# Patient Record
Sex: Female | Born: 1965 | Race: White | Hispanic: No | Marital: Single | Smoking: Never smoker
Health system: Southern US, Community
[De-identification: ages and names within clinical notes are randomized; demographics above are authoritative.]

## PROBLEM LIST (undated history)

## (undated) DIAGNOSIS — R87629 Unspecified abnormal cytological findings in specimens from vagina: Secondary | ICD-10-CM

## (undated) DIAGNOSIS — Z789 Other specified health status: Secondary | ICD-10-CM

## (undated) HISTORY — PX: CERVICAL BIOPSY  W/ LOOP ELECTRODE EXCISION: SUR135

## (undated) HISTORY — DX: Other specified health status: Z78.9

## (undated) HISTORY — DX: Unspecified abnormal cytological findings in specimens from vagina: R87.629

---

## 2012-12-03 HISTORY — PX: OTHER SURGICAL HISTORY: SHX169

## 2017-10-07 ENCOUNTER — Other Ambulatory Visit (HOSPITAL_BASED_OUTPATIENT_CLINIC_OR_DEPARTMENT_OTHER): Payer: Self-pay

## 2017-10-07 DIAGNOSIS — Z1231 Encounter for screening mammogram for malignant neoplasm of breast: Secondary | ICD-10-CM

## 2017-11-02 ENCOUNTER — Ambulatory Visit (HOSPITAL_BASED_OUTPATIENT_CLINIC_OR_DEPARTMENT_OTHER)
Admission: RE | Admit: 2017-11-02 | Discharge: 2017-11-02 | Disposition: A | Discharge status: Home / Self Care | Payer: PRIVATE HEALTH INSURANCE | Source: Ambulatory Visit

## 2017-11-02 ENCOUNTER — Encounter (HOSPITAL_BASED_OUTPATIENT_CLINIC_OR_DEPARTMENT_OTHER): Payer: Self-pay

## 2017-11-02 DIAGNOSIS — Z1231 Encounter for screening mammogram for malignant neoplasm of breast: Secondary | ICD-10-CM

## 2017-12-14 ENCOUNTER — Encounter (HOSPITAL_BASED_OUTPATIENT_CLINIC_OR_DEPARTMENT_OTHER): Payer: Self-pay

## 2017-12-14 ENCOUNTER — Ambulatory Visit (HOSPITAL_BASED_OUTPATIENT_CLINIC_OR_DEPARTMENT_OTHER)
Admission: RE | Admit: 2017-12-14 | Discharge: 2017-12-14 | Disposition: A | Discharge status: Home / Self Care | Payer: No Typology Code available for payment source | Source: Ambulatory Visit | Attending: Body Imaging | Admitting: Body Imaging

## 2017-12-14 DIAGNOSIS — Z1231 Encounter for screening mammogram for malignant neoplasm of breast: Secondary | ICD-10-CM | POA: Insufficient documentation

## 2018-01-17 ENCOUNTER — Ambulatory Visit (INDEPENDENT_AMBULATORY_CARE_PROVIDER_SITE_OTHER): Payer: PRIVATE HEALTH INSURANCE | Admitting: Family Medicine

## 2018-01-17 ENCOUNTER — Encounter: Payer: Self-pay | Admitting: Family Medicine

## 2018-01-17 VITALS — BP 132/80 | HR 94 | Temp 98.3°F | Ht 63.0 in | Wt 201.0 lb

## 2018-01-17 DIAGNOSIS — L989 Disorder of the skin and subcutaneous tissue, unspecified: Secondary | ICD-10-CM

## 2018-01-17 DIAGNOSIS — R002 Palpitations: Secondary | ICD-10-CM | POA: Diagnosis not present

## 2018-01-17 LAB — CBC
HCT: 38.4 % (ref 36.0–46.0)
Hemoglobin: 13.1 g/dL (ref 12.0–15.0)
MCHC: 34.2 g/dL (ref 30.0–36.0)
MCV: 89.5 fl (ref 78.0–100.0)
PLATELETS: 189 10*3/uL (ref 150.0–400.0)
RBC: 4.29 Mil/uL (ref 3.87–5.11)
RDW: 13.7 % (ref 11.5–15.5)
WBC: 4.1 10*3/uL (ref 4.0–10.5)

## 2018-01-17 LAB — TSH: TSH: 5.78 u[IU]/mL — ABNORMAL HIGH (ref 0.35–4.50)

## 2018-01-17 LAB — BASIC METABOLIC PANEL
BUN: 12 mg/dL (ref 6–23)
CHLORIDE: 105 meq/L (ref 96–112)
CO2: 28 meq/L (ref 19–32)
Calcium: 8.9 mg/dL (ref 8.4–10.5)
Creatinine, Ser: 0.66 mg/dL (ref 0.40–1.20)
GFR: 100.67 mL/min (ref >60.00)
GLUCOSE: 102 mg/dL — AB (ref 70–99)
POTASSIUM: 3.5 meq/L (ref 3.5–5.1)
SODIUM: 141 meq/L (ref 135–145)

## 2018-01-17 LAB — MAGNESIUM: Magnesium: 2 mg/dL (ref 1.5–2.5)

## 2018-01-17 NOTE — Patient Instructions (Addendum)
Stay well hydrated.  Mind your caffeine intake.    Keep an eye on the area next to your forehead. I think the dye has caused the area to become inflamed and irritated. If things don't settle down, we can always remove it and send it to the pathology lab to have someone take a closer look.   Let us know if you need anything.  Call Center for [place] at [facility] at [phone] for an appointment.  They are located at [address], [place], [place], [address] (right across the hall from our office).

## 2018-01-17 NOTE — Progress Notes (Signed)
Chief Complaint  Patient presents with  . Establish Care       New Patient Visit SUBJECTIVE: HPI: Yolanda Barber is an 52 y.o.female who is being seen for establishing care.  2 years has had spot on L side of face, has started to raise up over the past 3 weeks. She did note that she dyed her hair around this time and it started itching after that. No pain, some mild itching. No other new topicals. No personal or famhx of skin cancer.  She has been having palpitations intermittently for the past 2 years. No CP or SOB a/w, happens randomly. Usually lasts for a few seconds. No hx of anxiety. She does consume quite a bit of caffeine. No other know heart disease. EKG in past was neg, she is not currently having s/s's. Has it around 3-4 times per week.    Allergies  Allergen Reactions  . Contrast Media [Iodinated Diagnostic Agents]     Past Medical History:  Diagnosis Date  . No known health problems    Past Surgical History:  Procedure Laterality Date  . cholestetoma  2014   Social History   Socioeconomic History  . Marital status: Single  Tobacco Use  . Smoking status: Never Smoker  . Smokeless tobacco: Never Used  Substance and Sexual Activity  . Alcohol use: No    Frequency: Never  . Drug use: No   Family History  Problem Relation Age of Onset  . Hemophilia Brother     Takes no meds routinely.  No LMP recorded. Patient has had an ablation.  ROS Cardiovascular: Denies chest pain, +palpitations  Respiratory: Denies dyspnea   OBJECTIVE: BP 132/80 (BP Location: Left Arm, Patient Position: Sitting, Cuff Size: Normal)   Pulse 94   Temp 98.3 F (36.8 C) (Oral)   Ht 5\' 3"  (1.6 m)   Wt 201 lb (91.2 kg)   SpO2 97%   BMI 35.61 kg/m   Constitutional: -  VS reviewed -  Well developed, well nourished, appears stated age -  No apparent distress  Psychiatric: -  Oriented to person, place, and time -  Memory intact -  Affect and mood normal -  Fluent conversation,  good eye contact -  Judgment and insight age appropriate  Eye: -  Conjunctivae clear, no discharge -  Pupils symmetric, round, reactive to light  ENMT: -  MMM    Pharynx moist, no exudate, no erythema  Neck: -  No gross swelling, no palpable masses -  Thyroid midline, not enlarged, mobile, no palpable masses  Cardiovascular: -  RRR -  No LE edema  Respiratory: -  Normal respiratory effort, no accessory muscle use, no retraction -  Breath sounds equal, no wheezes, no ronchi, no crackles  Musculoskeletal: -  No clubbing, no cyanosis -  Gait normal  Skin: -  See below -  Warm and dry to palpation   Media Information     ASSESSMENT/PLAN: Skin lesion  Palpitations - Plan: CBC, Basic metabolic panel, TSH, Magnesium, HOLTER MONITOR - 72 HOUR  Keep an eye on skin lesion. Appears to be an inflamed SK. If continues to change, will let us know and we will biopsy it.  Ck labs, Holter for palpitations. Since she is not having current symptoms, will save time and money with avoiding EKG.  Patient should return at earliest convenience for CPE, fasting. The patient voiced understanding and agreement to the plan.   Wolfe, DO 01/17/18  2:18 PM

## 2018-01-17 NOTE — Progress Notes (Signed)
Pre visit review using our clinic review tool, if applicable. No additional management support is needed unless otherwise documented below in the visit note. 

## 2018-01-20 ENCOUNTER — Other Ambulatory Visit: Payer: Self-pay | Admitting: Family Medicine

## 2018-01-20 DIAGNOSIS — R002 Palpitations: Secondary | ICD-10-CM

## 2018-01-24 ENCOUNTER — Other Ambulatory Visit (INDEPENDENT_AMBULATORY_CARE_PROVIDER_SITE_OTHER): Payer: PRIVATE HEALTH INSURANCE

## 2018-01-24 DIAGNOSIS — R002 Palpitations: Secondary | ICD-10-CM | POA: Diagnosis not present

## 2018-01-24 LAB — T4, FREE: Free T4: 0.73 ng/dL (ref 0.60–1.60)

## 2018-02-03 ENCOUNTER — Ambulatory Visit (INDEPENDENT_AMBULATORY_CARE_PROVIDER_SITE_OTHER): Payer: PRIVATE HEALTH INSURANCE

## 2018-02-03 DIAGNOSIS — R002 Palpitations: Secondary | ICD-10-CM

## 2018-05-03 ENCOUNTER — Ambulatory Visit: Payer: Self-pay | Admitting: Podiatry

## 2018-06-20 ENCOUNTER — Encounter: Payer: Self-pay | Admitting: Family Medicine

## 2018-06-20 ENCOUNTER — Ambulatory Visit: Payer: PRIVATE HEALTH INSURANCE | Admitting: Family Medicine

## 2018-06-20 VITALS — BP 118/80 | HR 108 | Temp 98.0°F | Ht 63.0 in | Wt 203.4 lb

## 2018-06-20 DIAGNOSIS — D489 Neoplasm of uncertain behavior, unspecified: Secondary | ICD-10-CM

## 2018-06-20 NOTE — Progress Notes (Signed)
Pre visit review using our clinic review tool, if applicable. No additional management support is needed unless otherwise documented below in the visit note. 

## 2018-06-20 NOTE — Patient Instructions (Signed)
Do not shower for the rest of the day. When you do wash it, use only soap and water. Do not vigorously scrub. Apply triple antibiotic ointment (like Neosporin) twice daily. Keep the area clean and dry.   Things to look out for: increasing pain not relieved by ibuprofen/acetaminophen, fevers, spreading redness, drainage of pus, or foul odor.  Give [place] 1 week to get the results of your biopsy back.  OK to continue your facial cleanser.   Let us know if you need anything.

## 2018-06-20 NOTE — Progress Notes (Signed)
Chief Complaint  Patient presents with  . Follow-up    spot on face has inlarged    Yolanda Barber is a 52 y.o. female here for a skin complaint.  There is a spot on her L face that has gotten bigger and darker. This started around 1 mo ago. She is concerned it could be cancer. Pt did use a new facial cleanser, but no other area on her face has broken out. It does not hurt or itch, she has not tried anything topically at home.  ROS:  Const: No fevers Skin: As noted in HPI  Past Medical History:  Diagnosis Date  . No known health problems    BP 118/80 (BP Location: Left Arm, Patient Position: Sitting, Cuff Size: Normal)   Pulse (!) 108   Temp 98 F (36.7 C) (Oral)   Ht 5\' 3"  (1.6 m)   Wt 203 lb 6 oz (92.3 kg)   SpO2 97%   BMI 36.03 kg/m  Gen: awake, alert, appearing stated age Lungs: No accessory muscle use Skin: see below. No drainage, erythema, TTP, fluctuance, excoriation Psych: Age appropriate judgment and insight   Both larger and darker than prior image.  Procedure note; shave biopsy Informed consent was obtained. The area was cleaned with alcohol and injected with 1 mL of 1% lidocaine with epinephrine. A Dermablade was slightly bent and used to cut under the area of interest. The specimen was placed in a sterile specimen cup and sent to the lab. The area was then cauterized ensuring adequate hemostasis. The area was dressed with triple antibiotic ointment and a bandage. There were no complications noted. The patient tolerated the procedure well.   Neoplasm of uncertain behavior - Plan: PR SHAV SKIN LES 0.6-1.0 CM FACE,FACIAL  Orders as above. 1 week to get biopsy results.  Aftercare instructions provided and verbalized.  F/u prn. The patient voiced understanding and agreement to the plan.  [place], DO 06/20/18 4:02 PM

## 2018-09-17 ENCOUNTER — Telehealth: Payer: Self-pay | Admitting: Family Medicine

## 2018-09-17 NOTE — Telephone Encounter (Signed)
Call Center for [place] at [facility] at [phone] for an appointment.  They are located at [address], [place], [place], [address] (right across the hall from our office).  Doesn't need a referral, just call that number. If for whatever reason she does need one, OK to put through. TY.

## 2018-09-17 NOTE — Telephone Encounter (Signed)
Called the patient informed of PCP instructions 

## 2018-09-17 NOTE — Telephone Encounter (Signed)
Copied from [place] [id number]. Topic: General - Other >> Sep 17, 2018  3:58 PM Janace Aris A wrote: Reason for CRM: pt called in wanting to know if she can be referred to a GYN. Says she feels like it's been 2 years and she would like to have one scheduled in November if possible.   Please advise

## 2018-11-13 ENCOUNTER — Encounter: Payer: Self-pay | Admitting: Family Medicine

## 2018-11-13 ENCOUNTER — Ambulatory Visit (INDEPENDENT_AMBULATORY_CARE_PROVIDER_SITE_OTHER): Payer: PRIVATE HEALTH INSURANCE | Admitting: Family Medicine

## 2018-11-13 VITALS — BP 126/85 | HR 87 | Ht 63.0 in | Wt 191.0 lb

## 2018-11-13 DIAGNOSIS — Z01419 Encounter for gynecological examination (general) (routine) without abnormal findings: Secondary | ICD-10-CM | POA: Diagnosis not present

## 2018-11-13 DIAGNOSIS — Z1151 Encounter for screening for human papillomavirus (HPV): Secondary | ICD-10-CM

## 2018-11-13 DIAGNOSIS — Z124 Encounter for screening for malignant neoplasm of cervix: Secondary | ICD-10-CM | POA: Diagnosis not present

## 2018-11-13 DIAGNOSIS — Z113 Encounter for screening for infections with a predominantly sexual mode of transmission: Secondary | ICD-10-CM | POA: Diagnosis not present

## 2018-11-13 NOTE — Progress Notes (Signed)
GYNECOLOGY ANNUAL PREVENTATIVE CARE ENCOUNTER NOTE  Subjective:   Yolanda Barber is a 52 y.o. G79P0 female here for a routine annual gynecologic exam.  Current complaints: none.   Denies abnormal vaginal bleeding, discharge, pelvic pain, problems with intercourse or other gynecologic concerns.    Had endometrial ablation about 2-3 years ago. No menopausal symptoms.  Gynecologic History No LMP recorded. Patient has had an ablation. Patient is sexually active  Contraception: tubal ligation Last Pap: 2018. Results were: normal. Has h/o LEEP 2010 Last mammogram: 2018. Results were: normal  Obstetric History OB History  Gravida Para Term Preterm AB Living  4         4  SAB TAB Ectopic Multiple Live Births          4    # Outcome Date GA Lbr Len/2nd Weight Sex Delivery Anes PTL Lv  4 Gravida      CS-Unspec     3 Gravida      CS-Unspec     2 Gravida           1 [place]             Past Medical History:  Diagnosis Date  . No known health problems   . Vaginal Pap smear, abnormal    LEEP    Past Surgical History:  Procedure Laterality Date  . CERVICAL BIOPSY  W/ LOOP ELECTRODE EXCISION    . cholestetoma  2014    No current outpatient medications on file prior to visit.   No current facility-administered medications on file prior to visit.     Allergies  Allergen Reactions  . Contrast Media [Iodinated Diagnostic Agents]     Social History   Socioeconomic History  . Marital status: Single    Spouse name: Not on file  . Number of children: Not on file  . Years of education: Not on file  . Highest education level: Not on file  Occupational History  . Not on file  Social Needs  . Financial resource strain: Not on file  . Food insecurity:    Worry: Not on file    Inability: Not on file  . Transportation needs:    Medical: Not on file    Non-medical: Not on file  Tobacco Use  . Smoking status: Never Smoker  . Smokeless tobacco: Never Used  Substance and  Sexual Activity  . Alcohol use: No    Frequency: Never  . Drug use: No  . Sexual activity: Not on file  Lifestyle  . Physical activity:    Days per week: Not on file    Minutes per session: Not on file  . Stress: Not on file  Relationships  . Social connections:    Talks on phone: Not on file    Gets together: Not on file    Attends religious service: Not on file    Active member of club or organization: Not on file    Attends meetings of clubs or organizations: Not on file    Relationship status: Not on file  . Intimate partner violence:    Fear of current or ex partner: Not on file    Emotionally abused: Not on file    Physically abused: Not on file    Forced sexual activity: Not on file  Other Topics Concern  . Not on file  Social History Narrative  . Not on file    Family History  Problem Relation Age of Onset  .  Diabetes Mother   . Hemophilia Brother   . Cancer Maternal Grandmother   . Breast cancer Maternal Grandmother   . Hypertension Paternal Grandmother   . Stroke Paternal Aunt     The following portions of the patient's history were reviewed and updated as appropriate: allergies, current medications, past family history, past medical history, past social history, past surgical history and problem list.  Review of Systems Pertinent items are noted in HPI.   Objective:  BP 126/85   Pulse 87   Ht 5\' 3"  (1.6 m)   Wt 191 lb (86.6 kg)   BMI 33.83 kg/m  CONSTITUTIONAL: Well-developed, well-nourished female in no acute distress.  HENT:  Normocephalic, atraumatic, External right and left ear normal. Oropharynx is clear and moist EYES: Conjunctivae and EOM are normal. Pupils are equal, round, and reactive to light. No scleral icterus.  NECK: Normal range of motion, supple, no masses.  Normal thyroid.   CARDIOVASCULAR: Normal heart rate noted, regular rhythm RESPIRATORY: Clear to auscultation bilaterally. Effort and breath sounds normal, no problems with  respiration noted. BREASTS: Symmetric in size. No masses, skin changes, nipple drainage, or lymphadenopathy. ABDOMEN: Soft, normal bowel sounds, no distention noted.  No tenderness, rebound or guarding.  PELVIC: Normal appearing external genitalia; normal appearing vaginal mucosa and cervix.  No abnormal discharge noted.  Normal uterine size, no other palpable masses, no uterine or adnexal tenderness. MUSCULOSKELETAL: Normal range of motion. No tenderness.  No cyanosis, clubbing, or edema.  2+ distal pulses. SKIN: Skin is warm and dry. No rash noted. Not diaphoretic. No erythema. No pallor. NEUROLOGIC: Alert and oriented to person, place, and time. Normal reflexes, muscle tone coordination. No cranial nerve deficit noted. PSYCHIATRIC: Normal mood and affect. Normal behavior. Normal judgment and thought content.  Assessment:  Annual gynecologic examination with pap smear   Plan:  1. Well Woman Exam Will follow up results of pap smear and manage accordingly. Mammogram scheduled STD testing discussed. Patient requested testing  Routine preventative health maintenance measures emphasized. Please refer to After Visit Summary for other counseling recommendations.    Loma Boston, [place] for [employer]

## 2018-11-13 NOTE — Addendum Note (Signed)
Addended by: Phill Myron on: 11/13/2018 02:26 PM   Modules accepted: Orders

## 2018-11-18 LAB — CYTOLOGY - PAP
CHLAMYDIA, DNA PROBE: NEGATIVE
DIAGNOSIS: NEGATIVE
HPV: NOT DETECTED
NEISSERIA GONORRHEA: NEGATIVE

## 2018-11-24 ENCOUNTER — Telehealth: Payer: Self-pay

## 2018-11-24 NOTE — Telephone Encounter (Signed)
Patient made aware of normal pap smear. Kathrene Alu RN

## 2018-12-30 ENCOUNTER — Other Ambulatory Visit: Payer: Self-pay | Admitting: *Deleted

## 2018-12-30 ENCOUNTER — Ambulatory Visit (INDEPENDENT_AMBULATORY_CARE_PROVIDER_SITE_OTHER): Payer: PRIVATE HEALTH INSURANCE | Admitting: *Deleted

## 2018-12-30 ENCOUNTER — Telehealth: Payer: Self-pay | Admitting: *Deleted

## 2018-12-30 DIAGNOSIS — Z111 Encounter for screening for respiratory tuberculosis: Secondary | ICD-10-CM | POA: Diagnosis not present

## 2018-12-30 DIAGNOSIS — Z0184 Encounter for antibody response examination: Secondary | ICD-10-CM

## 2018-12-30 NOTE — Progress Notes (Signed)
.  ve

## 2018-12-30 NOTE — Telephone Encounter (Signed)
Patient notified

## 2018-12-30 NOTE — Progress Notes (Signed)
Patient here for tb test.    Tb test given.    Tb read scheduled for 01/01/19.

## 2018-12-30 NOTE — Telephone Encounter (Signed)
That's fine

## 2018-12-30 NOTE — Telephone Encounter (Signed)
Patient would like to get varicella titers done and possibly hep titer.  She would also like at tdap.  Are you ok with ordering these?

## 2019-01-01 ENCOUNTER — Ambulatory Visit (INDEPENDENT_AMBULATORY_CARE_PROVIDER_SITE_OTHER): Payer: PRIVATE HEALTH INSURANCE | Admitting: *Deleted

## 2019-01-01 ENCOUNTER — Other Ambulatory Visit (INDEPENDENT_AMBULATORY_CARE_PROVIDER_SITE_OTHER): Payer: PRIVATE HEALTH INSURANCE

## 2019-01-01 DIAGNOSIS — Z23 Encounter for immunization: Secondary | ICD-10-CM | POA: Diagnosis not present

## 2019-01-01 DIAGNOSIS — Z0184 Encounter for antibody response examination: Secondary | ICD-10-CM | POA: Diagnosis not present

## 2019-01-01 LAB — TB SKIN TEST
Induration: 0 mm
TB Skin Test: NEGATIVE

## 2019-01-01 NOTE — Addendum Note (Signed)
Addended by: Caffie Pinto on: 01/01/2019 02:18 PM   Modules accepted: Orders

## 2019-01-01 NOTE — Addendum Note (Signed)
Addended by: Caffie Pinto on: 01/01/2019 03:03 PM   Modules accepted: Orders

## 2019-01-02 LAB — VARICELLA ZOSTER ANTIBODY, IGG: Varicella IgG: 1684 index

## 2019-01-02 LAB — HEPATITIS B SURFACE ANTIBODY, QUANTITATIVE: Hep B S AB Quant (Post): 61 m[IU]/mL (ref >=10)

## 2019-01-08 ENCOUNTER — Ambulatory Visit (HOSPITAL_BASED_OUTPATIENT_CLINIC_OR_DEPARTMENT_OTHER): Payer: PRIVATE HEALTH INSURANCE

## 2019-01-16 ENCOUNTER — Inpatient Hospital Stay (HOSPITAL_BASED_OUTPATIENT_CLINIC_OR_DEPARTMENT_OTHER): Admission: RE | Admit: 2019-01-16 | Payer: PRIVATE HEALTH INSURANCE | Source: Ambulatory Visit

## 2019-01-27 ENCOUNTER — Telehealth: Payer: Self-pay | Admitting: Family Medicine

## 2019-01-27 NOTE — Telephone Encounter (Signed)
Spoke with patient notified her of results from 02/13/18 which where normal. She voiced her understanding.  Dr. Nani Ravens she also stated while in class today her Nurse instructor advised her that her heart was irregular and she just wanted to know what to do.  Please advise

## 2019-01-27 NOTE — Telephone Encounter (Signed)
Copied from [place] [id number]. Topic: Quick Communication - See Telephone Encounter >> Jan 27, 2019 12:58 PM Rutherford Nail, NT wrote: CRM for notification. See Telephone encounter for: 01/27/19. Patient calling and states that she had to wear a heart monitor back in 02/03/2018. States that she never received those results.

## 2019-01-28 NOTE — Telephone Encounter (Signed)
Patient informed. 

## 2019-01-28 NOTE — Telephone Encounter (Signed)
Called left message to call back 

## 2019-01-28 NOTE — Telephone Encounter (Signed)
We should have reached out to her. They were normal though. If she is continuing to have issues, can schedule appt if bothersome, but rhythm was reassuring on the testing. TY.

## 2019-01-30 ENCOUNTER — Ambulatory Visit (HOSPITAL_BASED_OUTPATIENT_CLINIC_OR_DEPARTMENT_OTHER)
Admission: RE | Admit: 2019-01-30 | Discharge: 2019-01-30 | Disposition: A | Discharge status: Home / Self Care | Payer: No Typology Code available for payment source | Source: Ambulatory Visit | Attending: Family Medicine | Admitting: Family Medicine

## 2019-01-30 DIAGNOSIS — Z01419 Encounter for gynecological examination (general) (routine) without abnormal findings: Secondary | ICD-10-CM | POA: Diagnosis present

## 2019-01-30 DIAGNOSIS — Z1231 Encounter for screening mammogram for malignant neoplasm of breast: Secondary | ICD-10-CM | POA: Insufficient documentation

## 2019-05-15 ENCOUNTER — Other Ambulatory Visit: Payer: Self-pay

## 2019-05-15 ENCOUNTER — Ambulatory Visit (INDEPENDENT_AMBULATORY_CARE_PROVIDER_SITE_OTHER): Payer: PRIVATE HEALTH INSURANCE | Admitting: Medical

## 2019-05-15 ENCOUNTER — Encounter: Payer: Self-pay | Admitting: Medical

## 2019-05-15 DIAGNOSIS — S30861A Insect bite (nonvenomous) of abdominal wall, initial encounter: Secondary | ICD-10-CM | POA: Diagnosis not present

## 2019-05-15 DIAGNOSIS — W57XXXA Bitten or stung by nonvenomous insect and other nonvenomous arthropods, initial encounter: Secondary | ICD-10-CM

## 2019-05-15 MED ORDER — DOXYCYCLINE HYCLATE 100 MG PO TABS: 100.0000 mg | ORAL_TABLET | Freq: Two times a day (BID) | ORAL | 0 refills | Status: DC

## 2019-05-15 NOTE — Progress Notes (Signed)
Subjective:    Patient ID: Yolanda Barber, female    DOB: 04-13-1966, 53 y.o.   MRN: [id number]  HPI  Virtual Visit via Telephone Note  I connected with Yolanda Barber on 05/15/19 at  3:40 PM EDT by telephone and verified that I am speaking with the correct person using two identifiers.  Location: Patient: home Provider: home    Pt has no blood pressure and pulse.   I discussed the limitations, risks, security and privacy concerns of performing an evaluation and management service by telephone and the availability of in person appointments. I also discussed with the patient that there may be a patient responsible charge related to this service. The patient expressed understanding and agreed to proceed.   History of Present Illness:  Pt had area where she thought was skin tag initially in shower. She scraped it off aggressively and saw red area in middle pin point red bump. Then she got clear area. She noticed ring around region where possible skin tag was. Pt concerned that it was tick bite that could have represnted lyme disease. This happened 9 days ago.   Observations/Objective: General- no acute distress, pleasant, normal speech. Skin- not seen bump and circular rash is on rt side.under breast.  Assessment and Plan: By your description it sounds like you had small tick that you scraped off aggresively and not skin tag Rash that appeared later sounded like bullys eye rash by descrption, I think reasonable to give rx doxycycline antibiotic as this covers both rmsf and lyme disease. Rx advisement given. Discussion on getting tick borne disease antibodies and pt deffered.  Follow up in 7 days or as needed  Mackie Pai, PA-C  Follow Up Instructions:    I discussed the assessment and treatment plan with the patient. The patient was provided an opportunity to ask questions and all were answered. The patient agreed with the plan and demonstrated an understanding of the  instructions.   The patient was advised to call back or seek an in-person evaluation if the symptoms worsen or if the condition fails to improve as anticipated.  I provided 15  minutes of non-face-to-face time during this encounter.   Mackie Pai, PA-C    Review of Systems  Constitutional: Positive for diaphoresis. Negative for chills, fatigue and fever.       No obvious hot flashes.  HENT: Negative for congestion, drooling and ear pain.        Occasionally at night. Not every night.  Respiratory: Negative for cough, chest tightness, shortness of breath and wheezing.   Cardiovascular: Negative for chest pain and palpitations.       Pt has hx of palpitations in th past with negative work up. No abnormal or sustained palpiations.  Gastrointestinal: Negative for abdominal pain and anal bleeding.  Genitourinary: Negative for dysuria, flank pain, frequency, urgency and vaginal pain.  Musculoskeletal: Negative for back pain.  Skin: Positive for rash.  Neurological: Negative for dizziness, seizures, weakness and light-headedness.  Hematological: Negative for adenopathy.  Psychiatric/Behavioral: Negative for behavioral problems, confusion and sleep disturbance. The patient is not nervous/anxious.     Past Medical History:  Diagnosis Date  . No known health problems   . Vaginal Pap smear, abnormal    LEEP     Social History   Socioeconomic History  . Marital status: Single    Spouse name: Not on file  . Number of children: Not on file  . Years of education: Not on file  .  Highest education level: Not on file  Occupational History  . Not on file  Social Needs  . Financial resource strain: Not on file  . Food insecurity    Worry: Not on file    Inability: Not on file  . Transportation needs    Medical: Not on file    Non-medical: Not on file  Tobacco Use  . Smoking status: Never Smoker  . Smokeless tobacco: Never Used  Substance and Sexual Activity  . Alcohol use: No     Frequency: Never  . Drug use: No  . Sexual activity: Not on file  Lifestyle  . Physical activity    Days per week: Not on file    Minutes per session: Not on file  . Stress: Not on file  Relationships  . Social Herbalist on phone: Not on file    Gets together: Not on file    Attends religious service: Not on file    Active member of club or organization: Not on file    Attends meetings of clubs or organizations: Not on file    Relationship status: Not on file  . Intimate partner violence    Fear of current or ex partner: Not on file    Emotionally abused: Not on file    Physically abused: Not on file    Forced sexual activity: Not on file  Other Topics Concern  . Not on file  Social History Narrative  . Not on file    Past Surgical History:  Procedure Laterality Date  . CERVICAL BIOPSY  W/ LOOP ELECTRODE EXCISION    . cholestetoma  2014    Family History  Problem Relation Age of Onset  . Diabetes Mother   . Hemophilia Brother   . Cancer Maternal Grandmother   . Breast cancer Maternal Grandmother   . Hypertension Paternal Grandmother   . Stroke Paternal Aunt     Allergies  Allergen Reactions  . Contrast Media [Iodinated Diagnostic Agents]     No current outpatient medications on file prior to visit.   No current facility-administered medications on file prior to visit.     There were no vitals taken for this visit.     Objective:   Physical Exam        Assessment & Plan:

## 2019-05-15 NOTE — Patient Instructions (Signed)
By your description it sounds like you had small tick that you scraped off aggresively and not skin tag Rash that appeared later sounded like bullys eye rash by descrption, I think reasonable to give rx doxycycline antibiotic as this covers both rmsf and lyme disease. Rx advisement given. Discussion on getting tick borne disease antibodies and pt deffered.  Follow up in 7 days or as needed

## 2019-05-18 ENCOUNTER — Telehealth: Payer: Self-pay | Admitting: Family Medicine

## 2019-05-18 NOTE — Telephone Encounter (Signed)
Please schedule w me. Ty.

## 2019-05-18 NOTE — Telephone Encounter (Signed)
Copied from [place] [id number]. Topic: Quick Communication - See Telephone Encounter >> May 18, 2019  4:45 PM Blase Mess A wrote: CRM for notification. See Telephone encounter for: 05/18/19.  Patient is calling to schedule appt with Dr.Wendling Please advise CB- [phone]

## 2019-05-18 NOTE — Telephone Encounter (Signed)
Copied from [place] [id number]. Topic: Quick Communication - See Telephone Encounter >> May 18, 2019  9:33 AM Blase Mess A wrote: CRM for notification. See Telephone encounter for: 05/18/19.  Patient is calling to get lab work completed after a visit on Friday with Percell Miller. In the after viist summary it states- "tick borne disease antibodies and pt deffered." Patient is calling to have labs placed. Please advise thank you. [phone] Jerilynn Mages)

## 2019-05-18 NOTE — Telephone Encounter (Signed)
Please advise 

## 2019-05-18 NOTE — Telephone Encounter (Signed)
Will you get her scheduled with her pcp/Dr. Nani Ravens. She took doxycycline which covers rmsf and lyme. So do think he needs to be aware and he may want to do labs to evaluate her.

## 2019-05-18 NOTE — Telephone Encounter (Signed)
Patient states symptoms have not improved fatigue, muscle feels weak and head ache all related to tick bite, please

## 2019-05-19 NOTE — Telephone Encounter (Signed)
Pt needing visit for possible lyme disease. In person visit scheduled for 05/22/2019.

## 2019-05-19 NOTE — Telephone Encounter (Signed)
appt scheduled for 05/22/2019

## 2019-05-22 ENCOUNTER — Ambulatory Visit (INDEPENDENT_AMBULATORY_CARE_PROVIDER_SITE_OTHER): Payer: PRIVATE HEALTH INSURANCE | Admitting: Family Medicine

## 2019-05-22 ENCOUNTER — Encounter: Payer: Self-pay | Admitting: Family Medicine

## 2019-05-22 ENCOUNTER — Other Ambulatory Visit: Payer: Self-pay

## 2019-05-22 VITALS — BP 142/90 | HR 91 | Temp 98.3°F | Ht 63.0 in | Wt 200.4 lb

## 2019-05-22 DIAGNOSIS — R21 Rash and other nonspecific skin eruption: Secondary | ICD-10-CM | POA: Diagnosis not present

## 2019-05-22 MED ORDER — FLUCONAZOLE 150 MG PO TABS: 150.0000 mg | ORAL_TABLET | Freq: Once | ORAL | 0 refills | Status: AC

## 2019-05-22 MED ORDER — DOXYCYCLINE HYCLATE 100 MG PO TABS: 100.0000 mg | ORAL_TABLET | Freq: Two times a day (BID) | ORAL | 0 refills | Status: AC

## 2019-05-22 NOTE — Patient Instructions (Addendum)
Let me know if you need anything.  Ask your insurance company if they cover labs associated with a physical. If they do, please schedule.  Let us know if you need anything.

## 2019-05-22 NOTE — Progress Notes (Signed)
Chief Complaint  Patient presents with  . Follow-up    tick bite    Yolanda Barber is a 53 y.o. female here for a skin complaint.  Duration: 1 week Location: R under breast Pruritic? No Painful? No Drainage? No  Bit by a tick New soaps/lotions/topicals/detergents? No Sick contacts? No Other associated symptoms: appeared like a target Therapies tried thus far: doxycycline  ROS:  Const: No fevers Skin: As noted in HPI  Past Medical History:  Diagnosis Date  . No known health problems   . Vaginal Pap smear, abnormal    LEEP    BP (!) 142/90 (BP Location: Left Arm, Patient Position: Sitting, Cuff Size: Normal)   Pulse 91   Temp 98.3 F (36.8 C) (Oral)   Ht 5\' 3"  (1.6 m)   Wt 200 lb 6 oz (90.9 kg)   SpO2 98%   BMI 35.49 kg/m  Gen: awake, alert, appearing stated age Lungs: No accessory muscle use Skin: Showed picture of target lesion. Today, central excoriation with purplish hyperpigmentation surrounding. No drainage, erythema, TTP, fluctuance Psych: Age appropriate judgment and insight  Target rash - Plan: doxycycline (VIBRA-TABS) 100 MG tablet, fluconazole (DIFLUCAN) 150 MG tablet  Complete full 3 weeks of doxy to be safe. Diflucan should she experience yeast infection from abx.  F/u for CPE and BP reck at earliest convenience. The patient voiced understanding and agreement to the plan.  [place], DO 05/22/19 4:35 PM

## 2020-09-30 IMAGING — MG DIGITAL SCREENING BILATERAL MAMMOGRAM WITH TOMO AND CAD
8 series · 8 of 24 positions shown · non-contrast
Comparison: Previous exam(s).

CLINICAL DATA: Screening.

EXAM:
DIGITAL SCREENING BILATERAL MAMMOGRAM WITH TOMO AND CAD

[L MLO synth-2D]
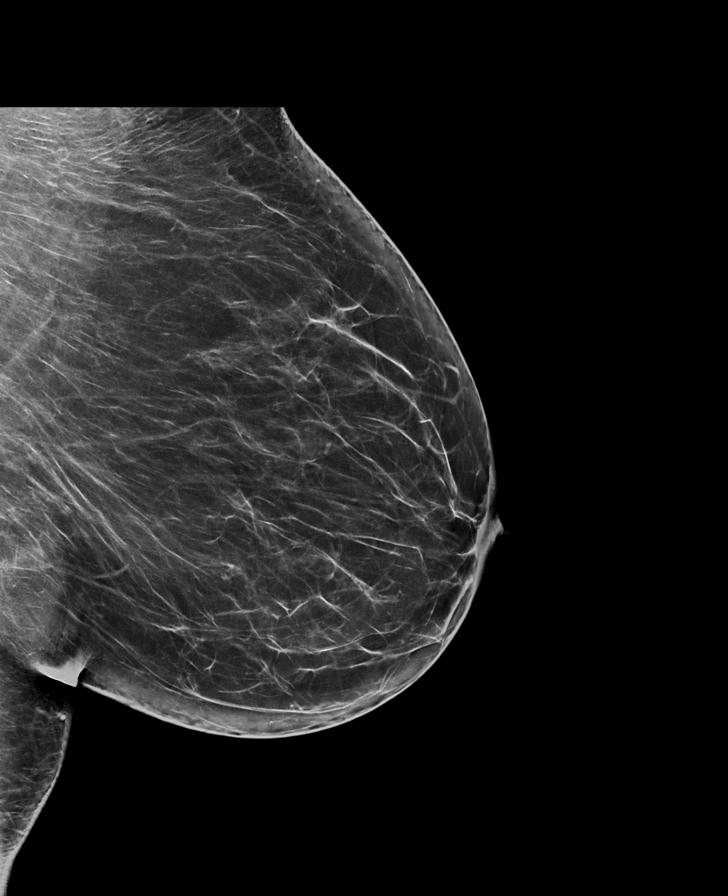

[R CC synth-2D]
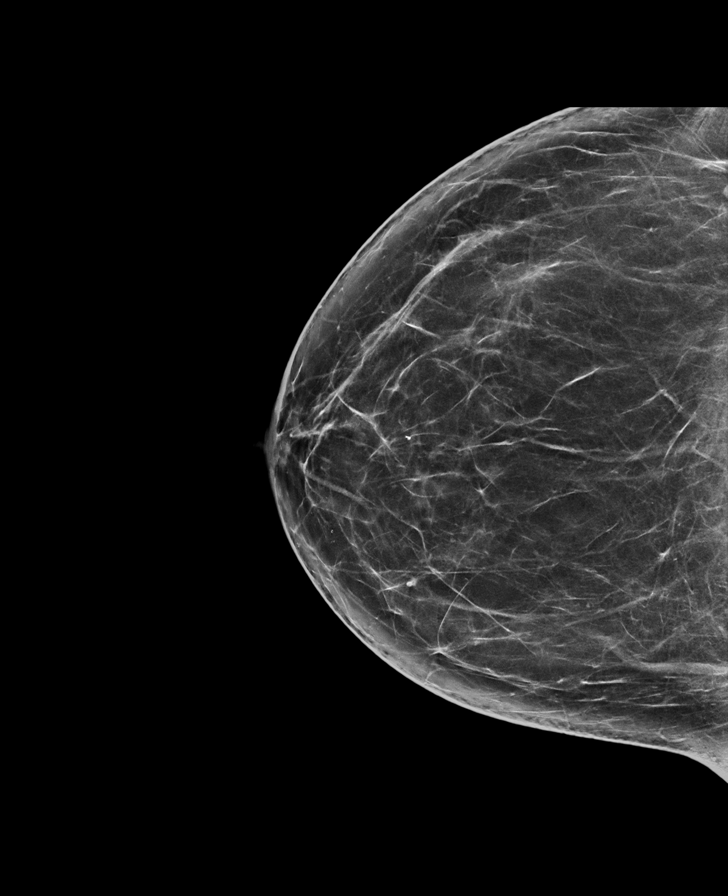

[L CC synth-2D]
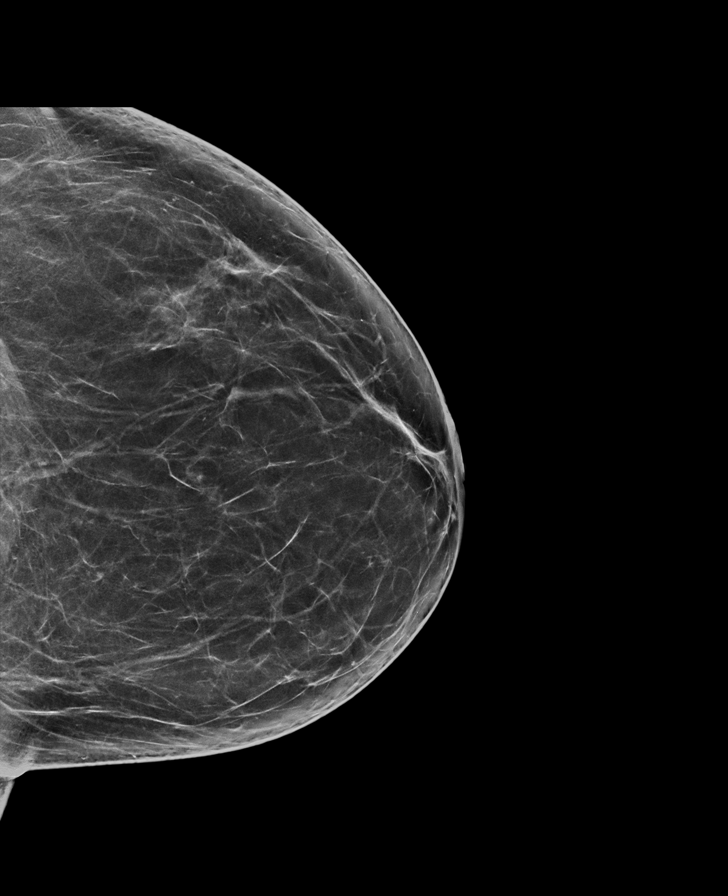

[R MLO synth-2D]
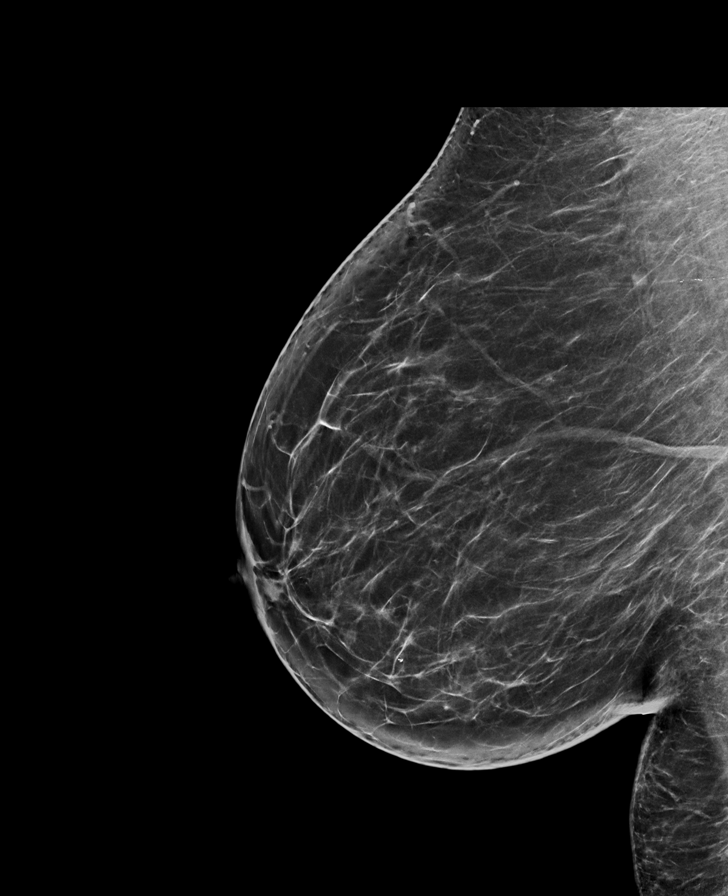

[R MLO tomo · tomo slice 41/80.0]
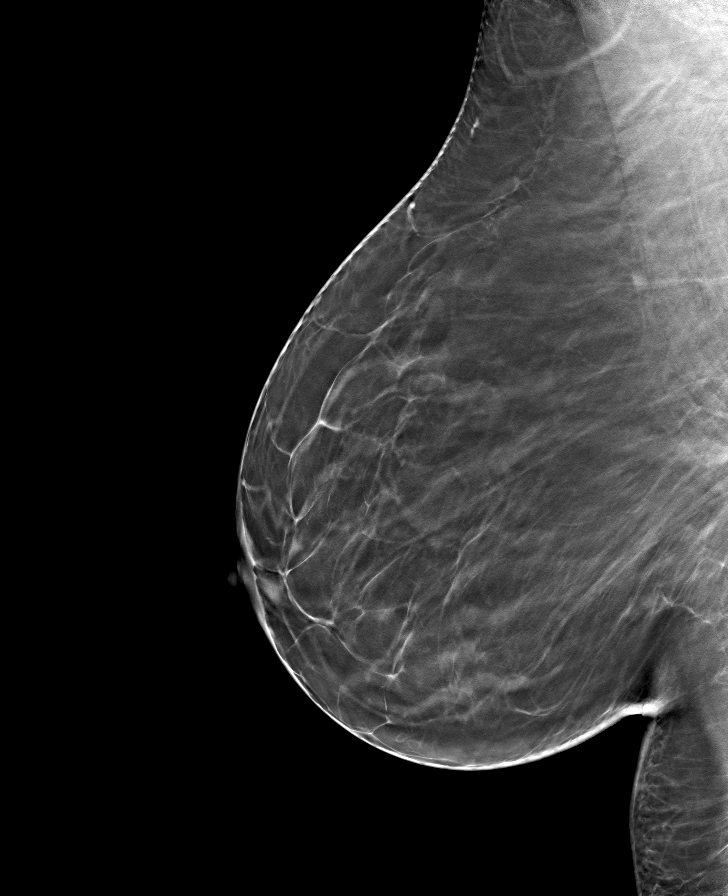

[L MLO tomo · tomo slice 39/77.0]
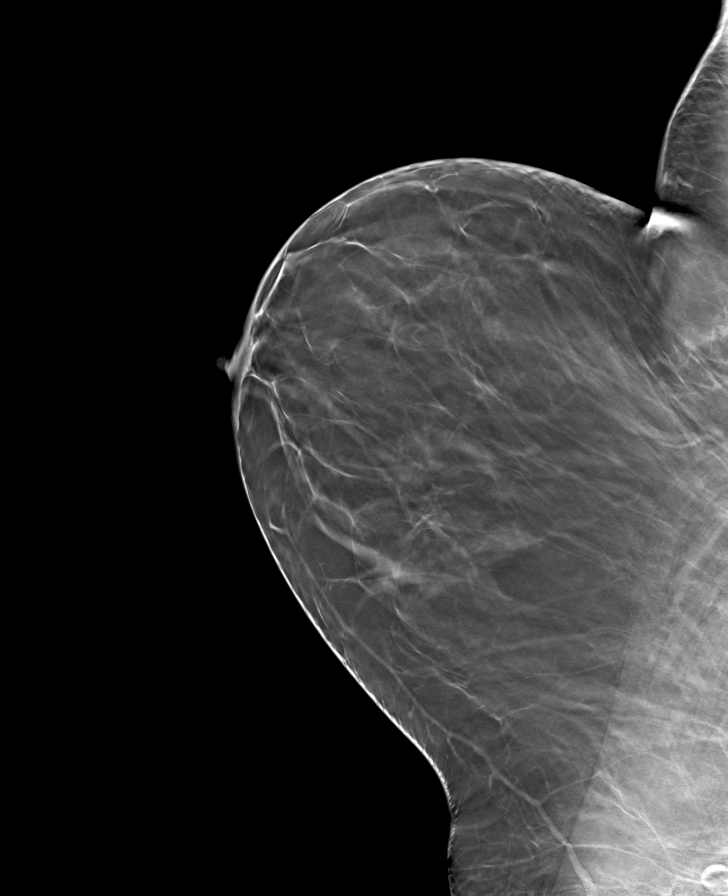

[R CC tomo · tomo slice 37/74.0]
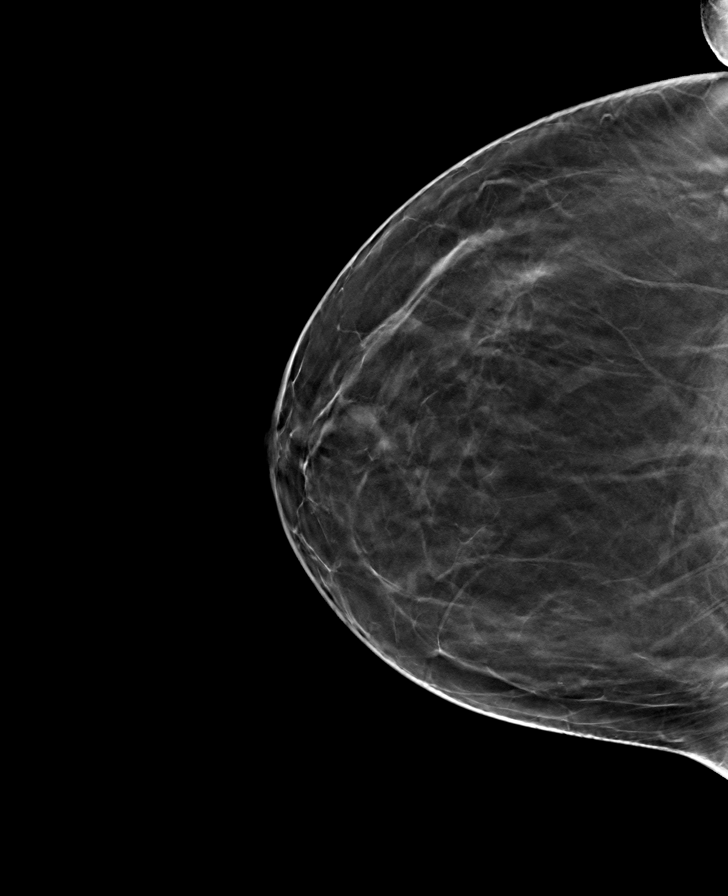

[L CC tomo · tomo slice 37/72.0]
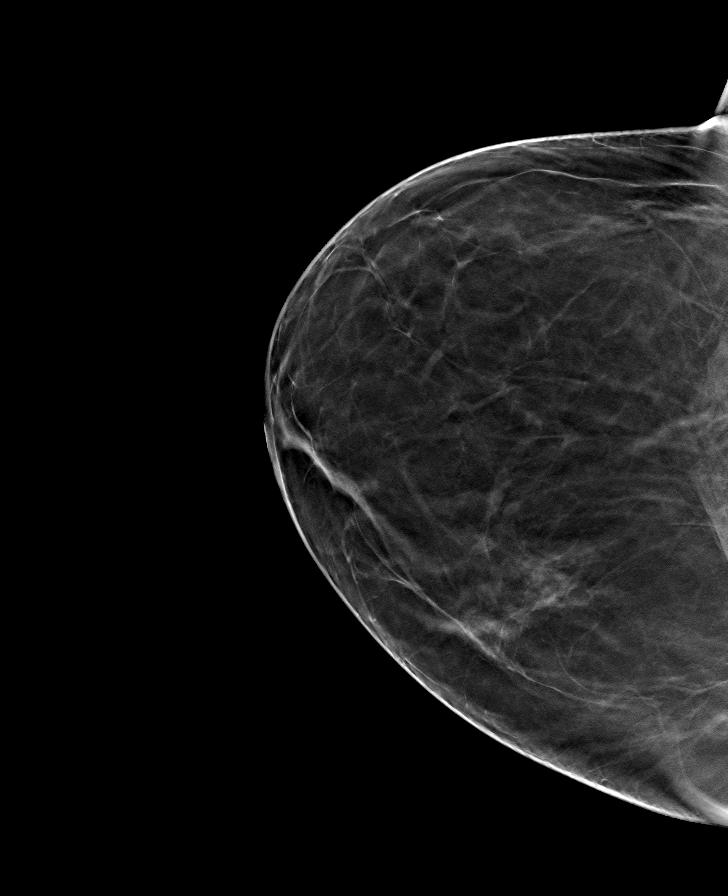

[8 of 24 positions shown; findings below may reference images not displayed]

ACR Breast Density Category b: There are scattered areas of
fibroglandular density.
FINDINGS: There are no findings suspicious for malignancy. Images were
processed with CAD.
IMPRESSION: No mammographic evidence of malignancy. A result letter of this
screening mammogram will be mailed directly to the patient.

RECOMMENDATION:
Screening mammogram in one year. (Code:[id number])

BI-RADS CATEGORY  1: Negative.
